# Patient Record
Sex: Male | Born: 1970 | Race: White | Hispanic: No | Marital: Married | Smoking: Never smoker
Health system: Southern US, Community
[De-identification: ages and names within clinical notes are randomized; demographics above are authoritative.]

## PROBLEM LIST (undated history)

## (undated) DIAGNOSIS — Z125 Encounter for screening for malignant neoplasm of prostate: Secondary | ICD-10-CM

## (undated) DIAGNOSIS — Z13220 Encounter for screening for lipoid disorders: Secondary | ICD-10-CM

## (undated) DIAGNOSIS — R109 Unspecified abdominal pain: Secondary | ICD-10-CM

## (undated) HISTORY — DX: Encounter for screening for lipoid disorders: Z13.220

## (undated) HISTORY — DX: Encounter for screening for malignant neoplasm of prostate: Z12.5

## (undated) HISTORY — DX: Unspecified abdominal pain: R10.9

## (undated) HISTORY — PX: APPENDECTOMY: SHX54

---

## 2004-03-29 ENCOUNTER — Emergency Department: Payer: Self-pay | Admitting: General Practice

## 2011-04-03 ENCOUNTER — Emergency Department: Payer: Self-pay | Admitting: Unknown Physician Specialty

## 2011-09-19 ENCOUNTER — Ambulatory Visit: Payer: Self-pay | Admitting: Internal Medicine

## 2011-10-18 ENCOUNTER — Ambulatory Visit: Payer: Self-pay | Admitting: Internal Medicine

## 2011-11-28 ENCOUNTER — Emergency Department: Payer: Self-pay | Admitting: Emergency Medicine

## 2011-12-31 ENCOUNTER — Encounter: Payer: Self-pay | Admitting: Cardiovascular Disease

## 2012-01-07 ENCOUNTER — Ambulatory Visit (INDEPENDENT_AMBULATORY_CARE_PROVIDER_SITE_OTHER): Payer: BC Managed Care – PPO | Admitting: Cardiovascular Disease

## 2012-01-07 ENCOUNTER — Encounter: Payer: Self-pay | Admitting: Cardiovascular Disease

## 2012-01-07 VITALS — BP 128/84 | HR 67 | Ht 70.0 in | Wt 225.5 lb

## 2012-01-07 DIAGNOSIS — R0789 Other chest pain: Secondary | ICD-10-CM

## 2012-01-07 DIAGNOSIS — R002 Palpitations: Secondary | ICD-10-CM

## 2012-01-07 NOTE — Patient Instructions (Addendum)
Your physician has recommended that you wear a holter monitor. Holter monitors are medical devices that record the heart's electrical activity. Doctors most often use these monitors to diagnose arrhythmias. Arrhythmias are problems with the speed or rhythm of the heartbeat. The monitor is a small, portable device. You can wear one while you do your normal daily activities. This is usually used to diagnose what is causing palpitations/syncope (passing out).  Follow up as needed.  

## 2012-01-09 ENCOUNTER — Encounter: Payer: Self-pay | Admitting: Cardiovascular Disease

## 2012-01-09 DIAGNOSIS — R002 Palpitations: Secondary | ICD-10-CM | POA: Insufficient documentation

## 2012-01-09 NOTE — Assessment & Plan Note (Signed)
I do think his bradycardia is concerning. He reports occasional heart rates to the 40s that he is usually asymptomatic with that. The only concerning issue is his complaints of palpitations when his heart rate is low. Thus, I think it's important to rule out other associated arrhythmia. I will obtain a 48-hour Holter monitor. The patient has no symptoms suggestive of heart failure or coronary artery disease. His cardiac exam is normal with no associated murmurs. Thus, no need for an echocardiogram unless  Holter monitor shows significant abnormalities.

## 2012-01-09 NOTE — Progress Notes (Signed)
HPI  This is a 41 year old male who was referred by Dr. Marvis Moeller for evaluation of palpitations and bradycardia. The patient reports no previous cardiac history. He has no chronic medical conditions and does not take any medications. He is not a smoker. He has noticed throughout his life that his heart rate is slow. When his heart rate is slow, he complains of palpitations and skipping in his heart. He denies any dizziness, syncope or presyncope. He reports no chest pain, dyspnea or orthopnea. He is able to do all his physical activities without limitations.  No Known Allergies   Current Outpatient Prescriptions on File Prior to Visit  Medication Sig Dispense Refill  . hydrochlorothiazide (HYDRODIURIL) 25 MG tablet Take 25 mg by mouth daily.         Past Medical History  Diagnosis Date  . Abdominal pain, unspecified site   . Special screening for malignant neoplasm of prostate   . Screening for lipoid disorders      Past Surgical History  Procedure Date  . Appendectomy      History reviewed. No pertinent family history.   History   Social History  . Marital Status: Married    Spouse Name: N/A    Number of Children: N/A  . Years of Education: N/A   Occupational History  . Not on file.   Social History Main Topics  . Smoking status: Never Smoker   . Smokeless tobacco: Not on file  . Alcohol Use: No     quit drinking in 2002  . Drug Use: No  . Sexually Active:    Other Topics Concern  . Not on file   Social History Narrative  . No narrative on file     ROS Constitutional: Negative for fever, chills, diaphoresis, activity change, appetite change and fatigue.  HENT: Negative for hearing loss, nosebleeds, congestion, sore throat, facial swelling, drooling, trouble swallowing, neck pain, voice change, sinus pressure and tinnitus.  Eyes: Negative for photophobia, pain, discharge and visual disturbance.  Respiratory: Negative for apnea, cough, chest tightness,  shortness of breath and wheezing.  Cardiovascular: Negative for chest pain and leg swelling.  Gastrointestinal: Negative for nausea, vomiting, abdominal pain, diarrhea, constipation, blood in stool and abdominal distention.  Genitourinary: Negative for dysuria, urgency, frequency, hematuria and decreased urine volume.  Musculoskeletal: Negative for myalgias, back pain, joint swelling, arthralgias and gait problem.  Skin: Negative for color change, pallor, rash and wound.  Neurological: Negative for dizziness, tremors, seizures, syncope, speech difficulty, weakness, light-headedness, numbness and headaches.  Psychiatric/Behavioral: Negative for suicidal ideas, hallucinations, behavioral problems and agitation. The patient is not nervous/anxious.     PHYSICAL EXAM   BP 128/84  Pulse 67  Ht 5\' 10"  (1.778 m)  Wt 225 lb 8 oz (102.286 kg)  BMI 32.36 kg/m2 Constitutional: He is oriented to person, place, and time. He appears well-developed and well-nourished. No distress.  HENT: No nasal discharge.  Head: Normocephalic and atraumatic.  Eyes: Pupils are equal and round. Right eye exhibits no discharge. Left eye exhibits no discharge.  Neck: Normal range of motion. Neck supple. No JVD present. No thyromegaly present.  Cardiovascular: Normal rate, regular rhythm, normal heart sounds and. Exam reveals no gallop and no friction rub. No murmur heard.  Pulmonary/Chest: Effort normal and breath sounds normal. No stridor. No respiratory distress. He has no wheezes. He has no rales. He exhibits no tenderness.  Abdominal: Soft. Bowel sounds are normal. He exhibits no distension. There is no tenderness.  There is no rebound and no guarding.  Musculoskeletal: Normal range of motion. He exhibits no edema and no tenderness.  Neurological: He is alert and oriented to person, place, and time. Coordination normal.  Skin: Skin is warm and dry. No rash noted. He is not diaphoretic. No erythema. No pallor.    Psychiatric: He has a normal mood and affect. His behavior is normal. Judgment and thought content normal.       EKG: Sinus  Rhythm  WITHIN NORMAL LIMITS   ASSESSMENT AND PLAN

## 2013-08-08 ENCOUNTER — Inpatient Hospital Stay: Payer: Self-pay | Admitting: Internal Medicine

## 2013-08-08 LAB — DRUG SCREEN, URINE
Amphetamines, Ur Screen: NEGATIVE (ref ?–1000)
BARBITURATES, UR SCREEN: NEGATIVE (ref ?–200)
Benzodiazepine, Ur Scrn: NEGATIVE (ref ?–200)
CANNABINOID 50 NG, UR ~~LOC~~: NEGATIVE (ref ?–50)
COCAINE METABOLITE, UR ~~LOC~~: NEGATIVE (ref ?–300)
MDMA (Ecstasy)Ur Screen: NEGATIVE (ref ?–500)
METHADONE, UR SCREEN: NEGATIVE (ref ?–300)
OPIATE, UR SCREEN: NEGATIVE (ref ?–300)
PHENCYCLIDINE (PCP) UR S: NEGATIVE (ref ?–25)
TRICYCLIC, UR SCREEN: NEGATIVE (ref ?–1000)

## 2013-08-08 LAB — URINALYSIS, COMPLETE
BACTERIA: NONE SEEN
BILIRUBIN, UR: NEGATIVE
Blood: NEGATIVE
Glucose,UR: NEGATIVE mg/dL (ref 0–75)
Hyaline Cast: 2
KETONE: NEGATIVE
LEUKOCYTE ESTERASE: NEGATIVE
Nitrite: NEGATIVE
PH: 6 (ref 4.5–8.0)
Protein: NEGATIVE
RBC,UR: <1 /HPF (ref 0–5)
SPECIFIC GRAVITY: 1.012 (ref 1.003–1.030)
Squamous Epithelial: NONE SEEN
WBC UR: <1 /HPF (ref 0–5)

## 2013-08-08 LAB — COMPREHENSIVE METABOLIC PANEL
ALK PHOS: 95 U/L
ALT: 41 U/L (ref 12–78)
ANION GAP: 6 — AB (ref 7–16)
Albumin: 3.8 g/dL (ref 3.4–5.0)
BILIRUBIN TOTAL: 0.6 mg/dL (ref 0.2–1.0)
BUN: 13 mg/dL (ref 7–18)
CALCIUM: 8.7 mg/dL (ref 8.5–10.1)
CHLORIDE: 106 mmol/L (ref 98–107)
CO2: 25 mmol/L (ref 21–32)
Creatinine: 1.08 mg/dL (ref 0.60–1.30)
EGFR (Non-African Amer.): >60
GLUCOSE: 103 mg/dL — AB (ref 65–99)
Osmolality: 274 (ref 275–301)
Potassium: 3.9 mmol/L (ref 3.5–5.1)
SGOT(AST): 18 U/L (ref 15–37)
SODIUM: 137 mmol/L (ref 136–145)
TOTAL PROTEIN: 7.1 g/dL (ref 6.4–8.2)

## 2013-08-08 LAB — CK TOTAL AND CKMB (NOT AT ARMC)
CK, Total: 100 U/L
CK-MB: 1 ng/mL (ref 0.5–3.6)

## 2013-08-08 LAB — TROPONIN I
Troponin-I: <0.02 ng/mL
Troponin-I: <0.02 ng/mL

## 2013-08-08 LAB — CBC WITH DIFFERENTIAL/PLATELET
Basophil #: 0.1 10*3/uL (ref 0.0–0.1)
Basophil %: 0.9 %
EOS ABS: 0.1 10*3/uL (ref 0.0–0.7)
EOS PCT: 1 %
HCT: 43.8 % (ref 40.0–52.0)
HGB: 15.1 g/dL (ref 13.0–18.0)
Lymphocyte #: 3.2 10*3/uL (ref 1.0–3.6)
Lymphocyte %: 41.8 %
MCH: 29.7 pg (ref 26.0–34.0)
MCHC: 34.5 g/dL (ref 32.0–36.0)
MCV: 86 fL (ref 80–100)
MONO ABS: 0.5 x10 3/mm (ref 0.2–1.0)
MONOS PCT: 6.8 %
NEUTROS ABS: 3.8 10*3/uL (ref 1.4–6.5)
NEUTROS PCT: 49.5 %
Platelet: 248 10*3/uL (ref 150–440)
RBC: 5.08 10*6/uL (ref 4.40–5.90)
RDW: 12.9 % (ref 11.5–14.5)
WBC: 7.7 10*3/uL (ref 3.8–10.6)

## 2013-08-08 LAB — PROTIME-INR
INR: 1
Prothrombin Time: 12.6 secs (ref 11.5–14.7)

## 2013-08-08 LAB — HEMOGLOBIN A1C: Hemoglobin A1C: 5.7 % (ref 4.2–6.3)

## 2014-06-07 ENCOUNTER — Emergency Department: Payer: Self-pay | Admitting: Emergency Medicine

## 2014-09-04 NOTE — Discharge Summary (Signed)
PATIENT NAMTroy [name]:  [name], [name] MR#:  [id number] DATE OF BIRTH:  02/26/1971  DATE OF ADMISSION:  08/08/2013 DATE OF DISCHARGE:  08/09/2013  PRIMARY CARE PHYSICIAN: Christian CroonMeindert Niemeyer, MD  PRIMARY CARE PHYSICIAN: Christian BlackwaterShaukat Khan, MD, cardiologist.   REASON FOR ADMISSION: Palpitations.   HOSPITAL COURSE: The patient is a 44 year old Caucasian male with a history of palpitation, presented to the ED with palpitations and a slow heart rate at about 20 to 30. The patient had chest tightness as well. He has a slow heart rate. In addition, the patient gained 40 pounds for the past 3 to 4 months with leg edema. For detailed history and physical examination, please refer to the admission note dictated by me. On admission date, the patient's glucose 103, BUN 13, creatinine 1.08. Electrolytes were normal. Troponin less than 0.02. CBC within normal range. Chest x-ray did not show any cardiopulmonary disease. EKG showed normal sinus rhythm with a 68 BPM. Urinalysis is negative. Hemoglobin A1c 5.7. Drug screen was negative, severe bradycardia. After admission, the patient has been on telemonitor. The patient's heart rate has been from 50s to 70s and the patient got an echocardiograph which was normal with an ejection fraction 50% to 55% and Dr. Welton [name] evaluated the patient and suggested the patient has no severe bradycardia during the hospitalization. He may be discharged and follow up with him as outpatient. The patient needs a Holter monitor as an outpatient.  FINAL DIAGNOSES: Severe bradycardia, tachybrady syndrome, obesity.   CONDITION: Stable.   CODE STATUS: Full code.   HOME MEDICATIONS: Please refer to medication reconciliation list. Continue the patient's home medication.   DIET: Low-fat, low-cholesterol diet.   ACTIVITY: As tolerated.   FOLLOWUP CARE: Follow with his PCP in 2-4 weeks. Follow up with Dr. Welton [name] within 1-2 weeks.   I discussed the patient's discharge plan with the patient, nurse, Dr. Welton [name].    TIME SPENT: About 36 minutes.    ____________________________ Christian PollackQing Kharter Brew, MD qc:lt D: 08/09/2013 15:31:33 ET T: 08/10/2013 03:50:04 ET JOB#: [id number]  cc: Christian PollackQing Jerold Yoss, MD, <Dictator> Christian PollackQING Bayan Hedstrom MD ELECTRONICALLY SIGNED 08/10/2013 13:54

## 2014-09-04 NOTE — H&P (Signed)
PATIENT NAMTroy [name]:  [name], [name] MR#:  [id number] DATE OF BIRTH:  1970/12/09  DATE OF ADMISSION:  08/08/2013  PRIMARY CARE PHYSICIAN:  Dr. Lacie ScottsNiemeyer.  CHIEF COMPLAINT:  Palpitation since last night.   HISTORY OF PRESENT ILLNESS:  A 44 year old Caucasian male with a past history of palpitations who presented in the ED with palpitations since last night.  The patient is alert, awake, oriented, in no acute distress.  According to the patient, the patient felt palpitation since last night until early this morning.  The patient monitored his heart rate with some monitor.  He found his heart rate was slow with the lowest heart rate about 20 to 30, so patient came to the ED for further evaluation.  The patient also feels chest tightness, revealed the patient's heart rate is slow.  Actually, the patient has had a cough with sputum on and off for the past one year.  He also complains of leg edema and gained weight about 40 pounds for the past 3 to 4 months.  In addition, the patient complains of polyuria, but he denies any orthopnea, nocturnal dyspnea.   PAST MEDICAL HISTORY:  Palpitations.  PAST SURGICAL HISTORY:  Appendectomy.   FAMILY HISTORY:  Father has CHF.  Sister has CHF.  No heart attack or stroke in his family.   HOME MEDICATIONS:  1.  Nexium 20 mg by mouth two cap once a day.  2.  EpiPen two pack 0.3 mg injectable once.  3.  Cetirizine 10 mg by mouth once a day.   REVIEW OF SYSTEMS:  CONSTITUTIONAL:  The patient denies any fever or chills.  No headache or dizziness.  No weakness.  EYES:  No double vision or blurred vision.  EARS, NOSE, THROAT:  No postnasal drip, slurred speech or dysphagia.  CARDIOVASCULAR:  No chest pain, but has chest tightness, palpitations.  No orthopnea, nocturnal dyspnea, but has leg edema.  PULMONARY:  Has a cough with sputum.  Has mild shortness of breath, but no hemoptysis.  GASTROINTESTINAL:  No abdominal pain, nausea, vomiting, diarrhea.  No melena or bloody stool.   GENITOURINARY:  No dysuria, hematuria, or incontinence.  SKIN:  No rash or jaundice.  NEUROLOGY:  No syncope, loss of consciousness or seizure.  HEMATOLOGIC:  No easy bleeding or bleeding.  ENDOCRINE:  Positive for polyuria, but  no polydipsia, heat or cold intolerance.  SKIN:  No rash or jaundice.   PHYSICAL EXAMINATION: VITAL SIGNS:  Temperature 98.2, blood pressure 132/83, pulse 52, O2 saturation 97% on room air.  GENERAL:  The patient is alert, awake, oriented, in no acute distress.  HEENT:  Pupils round, equal, reactive to light and accommodation.  Moist oral mucosa.  Clear oropharynx. NECK:  Supple.  No JVD or carotid bruit.  No lymphadenopathy.  No thyromegaly.  CARDIOVASCULAR:  S1, S2, regular rate, rhythm.  No murmur, no gallop.  PULMONARY:  Bilateral air entry.  No wheezing or rales.  No use of accessory muscle to breathe.  ABDOMEN:  Soft and obese.  Bowel sounds present.  No distention or tenderness.  No organomegaly.  EXTREMITIES:  Trace edema on bilateral lower extremities.  No clubbing.  No cyanosis.  No calf tenderness.  Bilateral pedal pulses present.  SKIN:  No rash or jaundice.  NEUROLOGIC:  A and O x 3.  No focal deficit.  Power 5 out of 5.  Sensation intact.   LABORATORY DATA:  Glucose 103, BUN 13, creatinine 1.08.  Electrolytes normal.  Troponin less than  0.02 three times.  CBC in normal range.  Chest x-ray, no active cardiopulmonary disease.  EKG showed normal sinus rhythm at 68 BPM.  Urinalysis is negative.  Hemoglobin A1c 5.7.  Drug screen negative.   IMPRESSIONS: 1.  Severe bradycardia.  2.  Questionable congestive heart failure.  3.  Obesity.  4.  Metabolic syndrome.   PLAN OF TREATMENT:   1.  The patient will be admitted to telemetry floor.  We will get an echocardiograph, cardiology consult and we will start Lasix 20 mg by mouth daily.   2.  Gastrointestinal and deep vein thrombosis prophylaxis.    I discussed the patient's condition and plan of treatment  with the patient and the patient's wife.   TIME SPENT:  About 58 minutes.    ____________________________ Shaune Pollack, MD qc:ea D: 08/08/2013 14:00:47 ET T: 08/08/2013 16:08:32 ET JOB#: [id number]  cc: Shaune Pollack, MD, <Dictator> Shaune Pollack MD ELECTRONICALLY SIGNED 08/09/2013 15:46

## 2014-09-04 NOTE — Consult Note (Signed)
PATIENT NAME:  [name], [place] MR#:  [id number] DATE OF BIRTH:  04/26/1971  CARDIOLOGY CONSULTATION   DATE OF CONSULTATION:  08/08/2013  CONSULTING PHYSICIAN:  Laurier NancyShaukat A. Khan, MD  INDICATION FOR CONSULTATION: Bradycardia and palpitations.   HISTORY OF PRESENT ILLNESS: This is a 44 year old white male with a past medical history of no significant medical problems, who presented to the Emergency Room with palpitations and chest tightness. Apparently, he states that he has bradycardia at home. He monitors his pulse by pulse oximetry, and he says his bradycardia was as low as 13 one time, but he does not have a video of that. He has a video of heart rate going as low as 36 on pulse oximetry made by his iPhone. He says that he had some chest pain associated with shortness of breath when his heart rate went down as low as 20 to 30, and that is why he came into the hospital. He has history of having bradycardia in the past also. He usually has these bouts every year.   PAST MEDICAL HISTORY: He has no history of hypertension, diabetes, hyperlipidemia.   SOCIAL HISTORY: Denies EtOH abuse or smoking.   FAMILY HISTORY: Positive for coronary artery disease.   ALLERGIES: None.   PHYSICAL EXAMINATION:  GENERAL: Reveals his blood pressure is 132/83, pulse is 52, respirations 16, temperature is 98.2, saturation is 97.  NECK: Revealed no JVD.  LUNGS: Clear.  HEART: Regular rate and rhythm. Normal S1, S2. No audible murmur.  ABDOMEN: Soft, nontender. Positive bowel sounds.  EXTREMITIES: No pedal edema.  NEUROLOGICAL: Appears to be intact.   DIAGNOSTIC STUDIES: EKG shows sinus rhythm, 67 beats per minute. No acute changes. There are some rhythm strips that show a heart rate about 45 to 50, and on the monitor, it also shows as low as 45, but no pauses and no evidence of severe bradycardia at this time, and hemodynamically, appears to be stable.   ASSESSMENT AND PLAN: The patient has bradycardia, supposedly  in the 20s to 30s, need documentation. Pulse oximetry is not very accurate in documenting bradycardia. Need documentation either on monitor or Holter monitor. Need at least 3-second pauses to qualify for pacemaker. The patient appears to be quite asymptomatic at this time. Advise observation on telemetry for another day or two and may consider after that. If there is no severe bradycardia, he can be discharged, with followup in the office for event monitor.  Thank you very much for referral.   ____________________________ Laurier NancyShaukat A. Khan, MD sak:lb D: 08/08/2013 11:23:17 ET T: 08/08/2013 11:39:37 ET JOB#: [id number]  cc: Laurier NancyShaukat A. Khan, MD, <Dictator> Laurier NancySHAUKAT A KHAN MD ELECTRONICALLY SIGNED 08/26/2013 13:58

## 2015-04-15 IMAGING — CR DG CHEST 2V
1 series · 2 of 2 positions shown · non-contrast
Comparison: Prior CT from 10/18/2011

CLINICAL DATA: Chest pain and palpitations

EXAM:
CHEST  2 VIEW

[Series 2: w chest lat · 0.14mm/px · 2 of 2 slices shown]
[im 1/2]
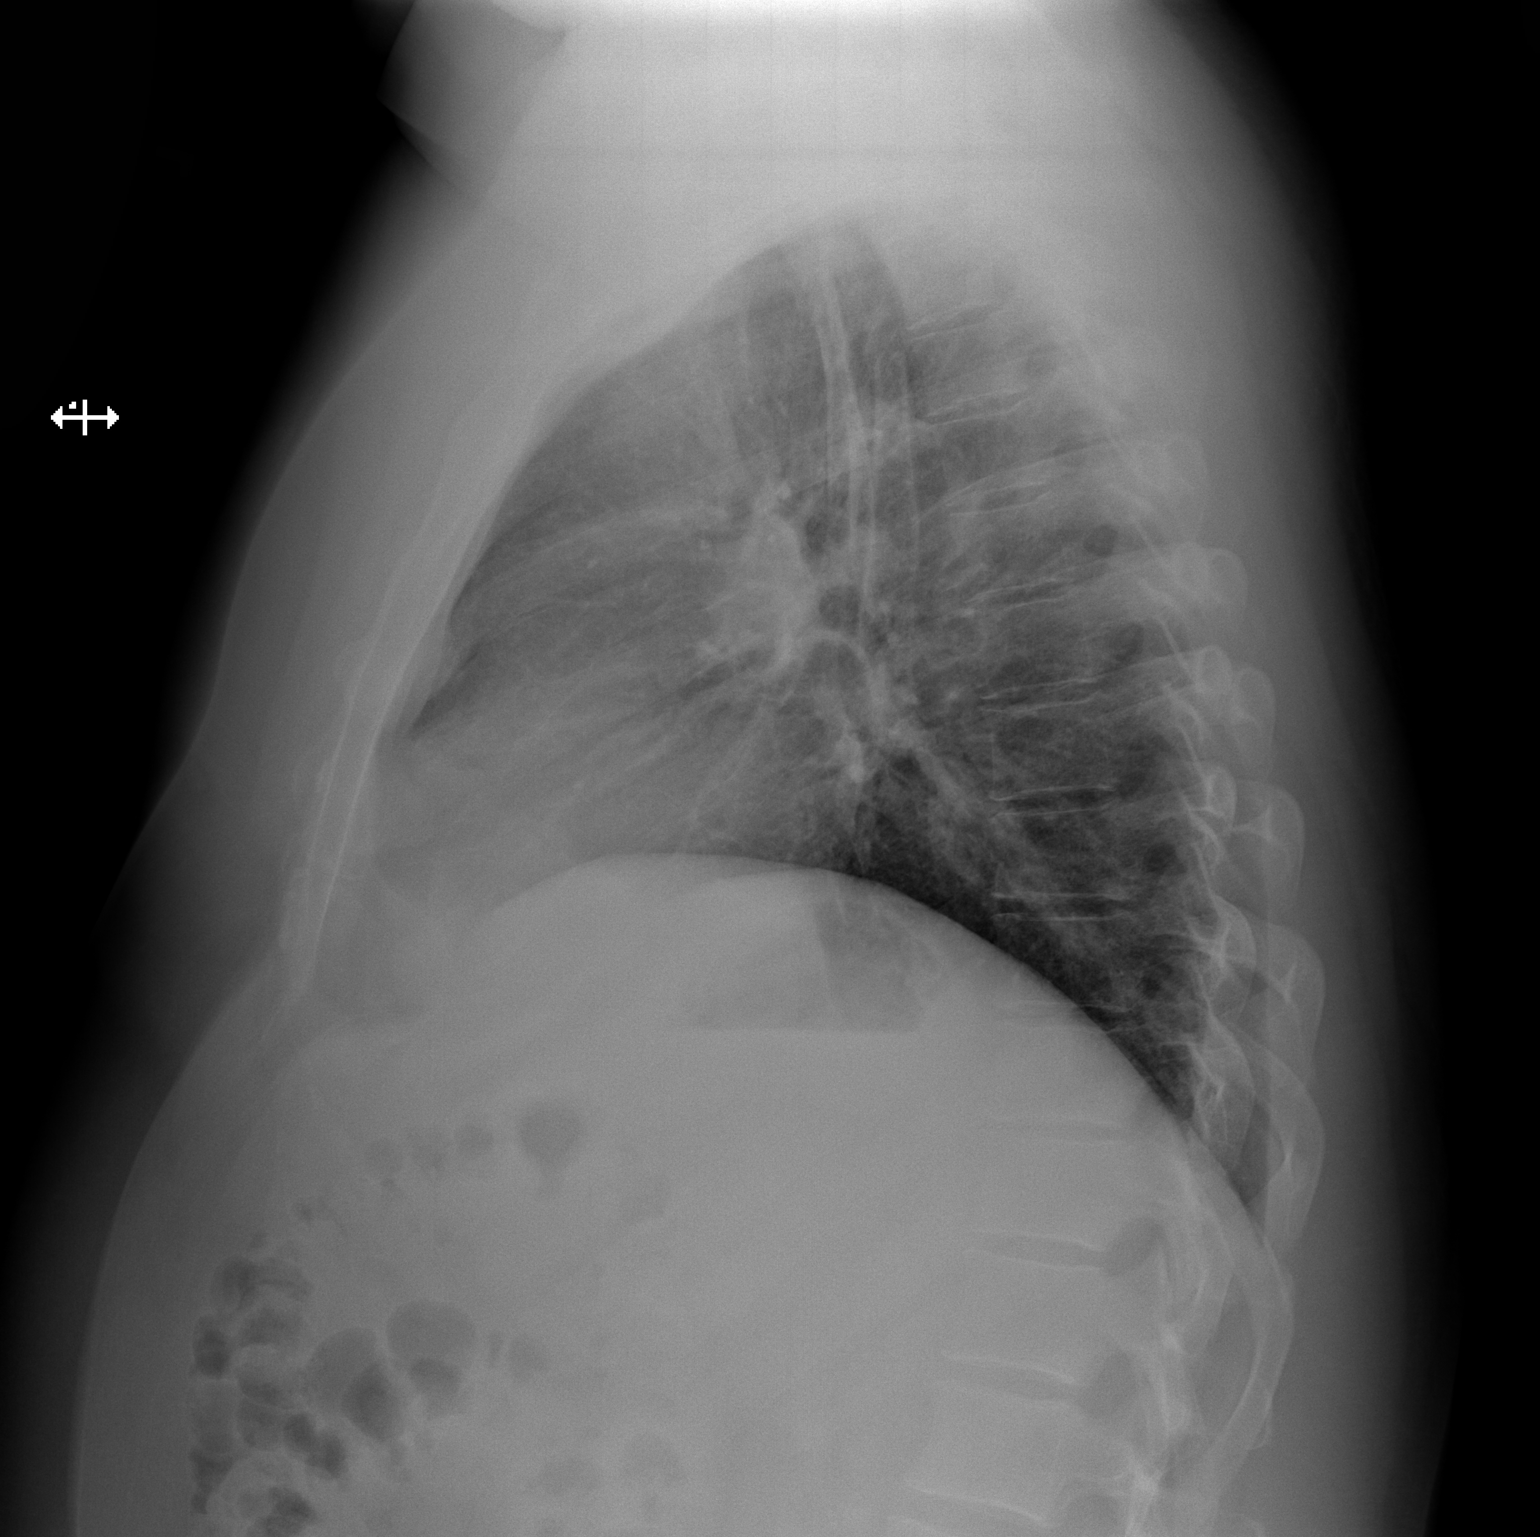
[im 2/2]
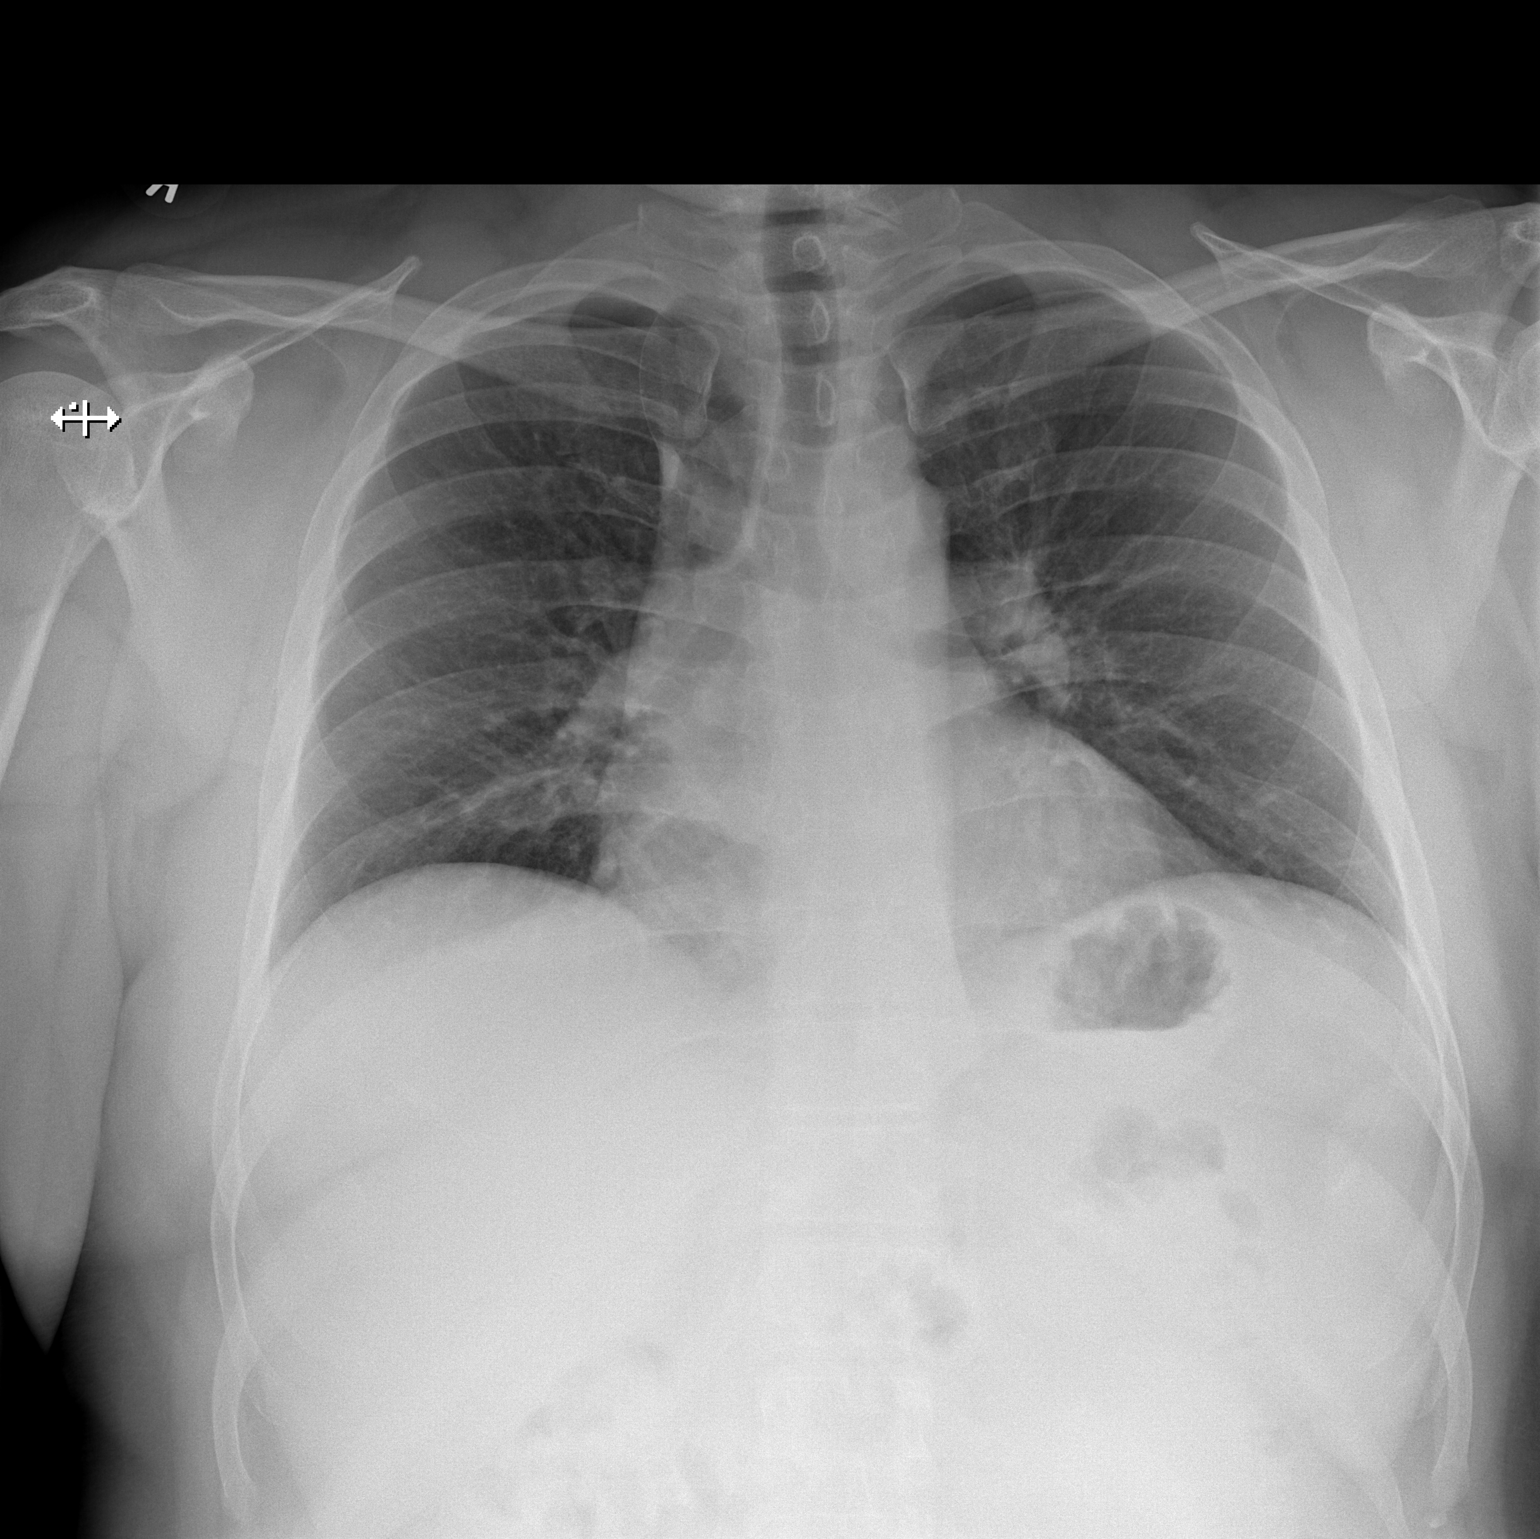

[2 of 2 positions shown; findings below may reference images not displayed]

FINDINGS: The cardiac and mediastinal silhouettes are stable in size and
contour, and remain within normal limits.

The lungs are mildly hypoinflated. No airspace consolidation,
pleural effusion, or pulmonary edema is identified. There is no
pneumothorax.

No acute osseous abnormality identified.
IMPRESSION: No active cardiopulmonary disease.

## 2016-02-12 IMAGING — CR SACRUM AND COCCYX - 2+ VIEW
1 series · 3 of 3 positions shown · non-contrast
Comparison: None.

CLINICAL DATA: Fall.  Sacral pain.

EXAM:
SACRUM AND COCCYX - 2+ VIEW

[Series 1: dxr sacrum and coccyx · 0.14mm/px · 3 of 3 slices shown]
[im 1/3]
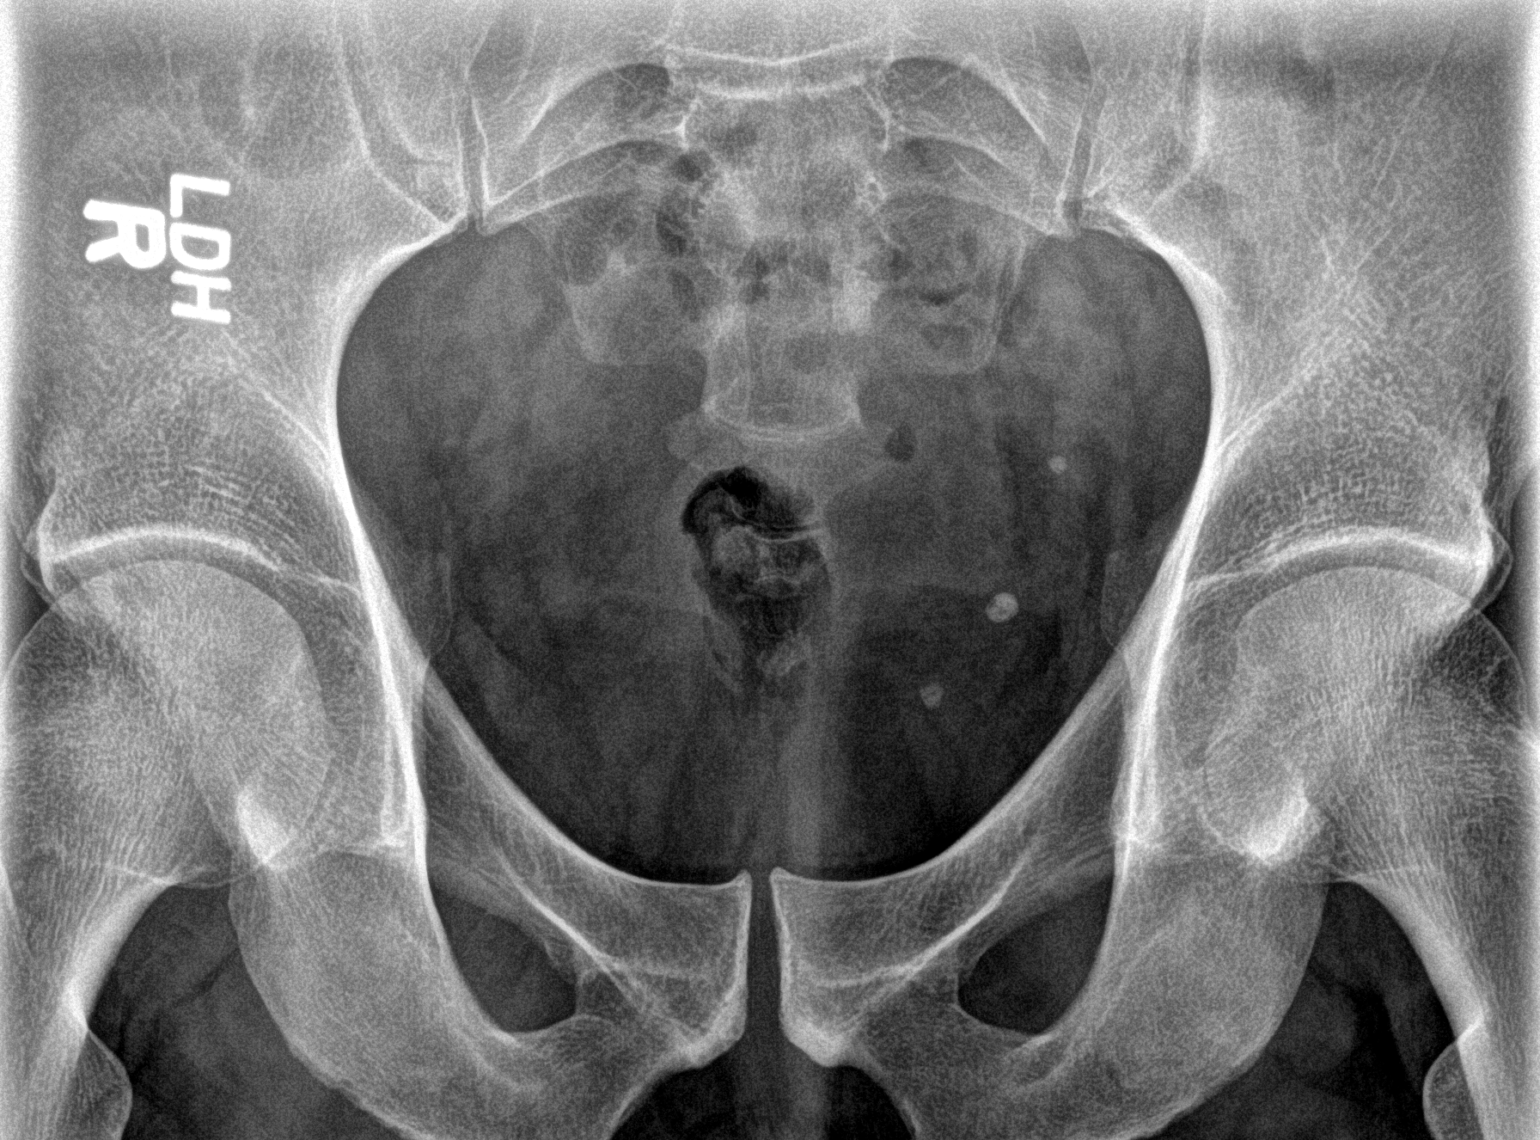
[im 2/3]
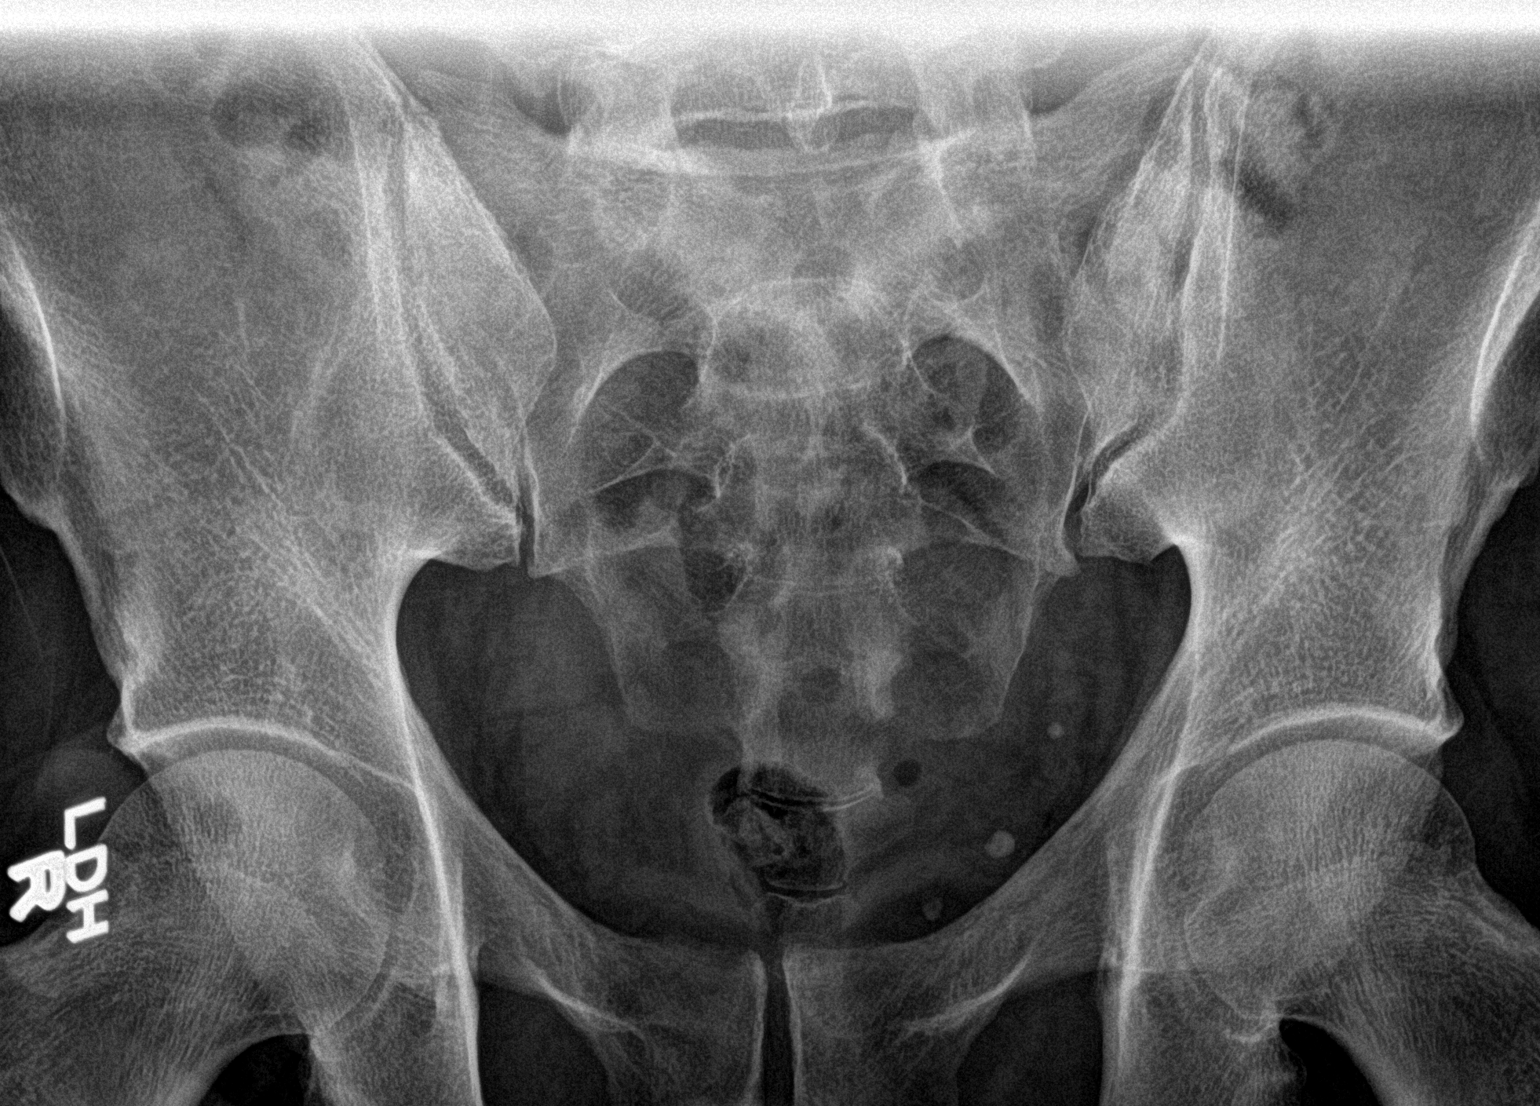
[im 3/3]
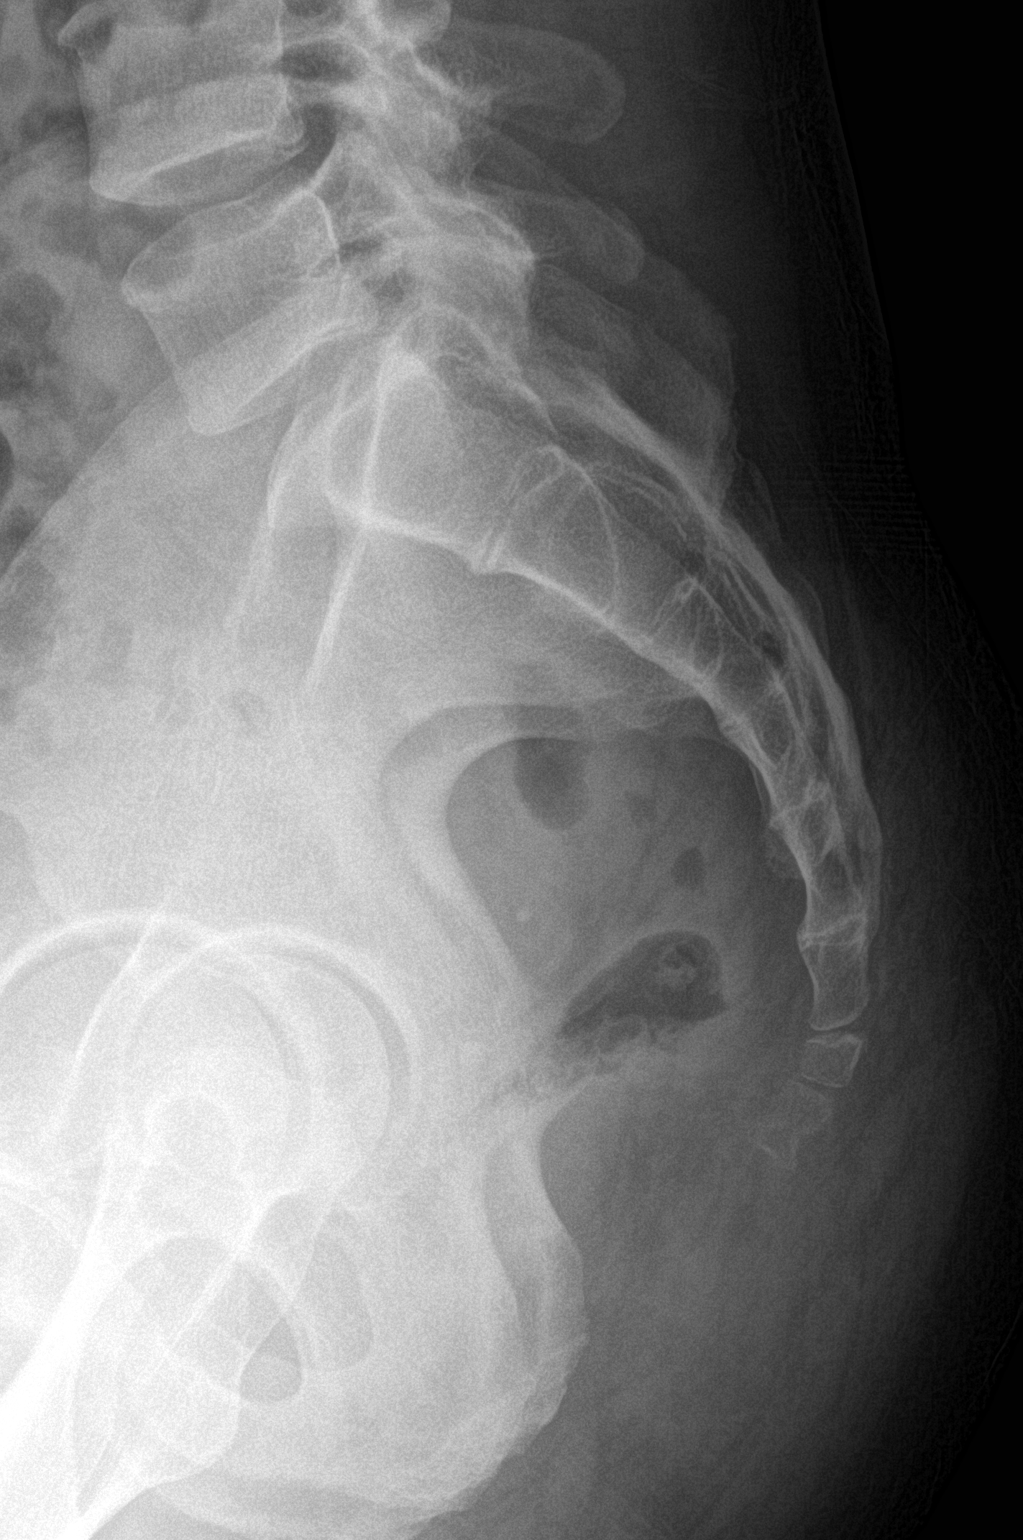

[3 of 3 positions shown; findings below may reference images not displayed]

FINDINGS: There is no evidence of fracture or other focal bone lesions.
IMPRESSION: Negative.

## 2016-02-12 IMAGING — CR DG SHOULDER 3+V*R*
1 series · 3 of 3 positions shown · non-contrast
Comparison: None.

CLINICAL DATA: Status post slip and fall today. Right shoulder
pain. Initial encounter.

EXAM:
DG SHOULDER 3+ VIEWS RIGHT

[Series 1: dxr shoulder right complete · 0.14mm/px · 3 of 3 slices shown]
[im 1/3]
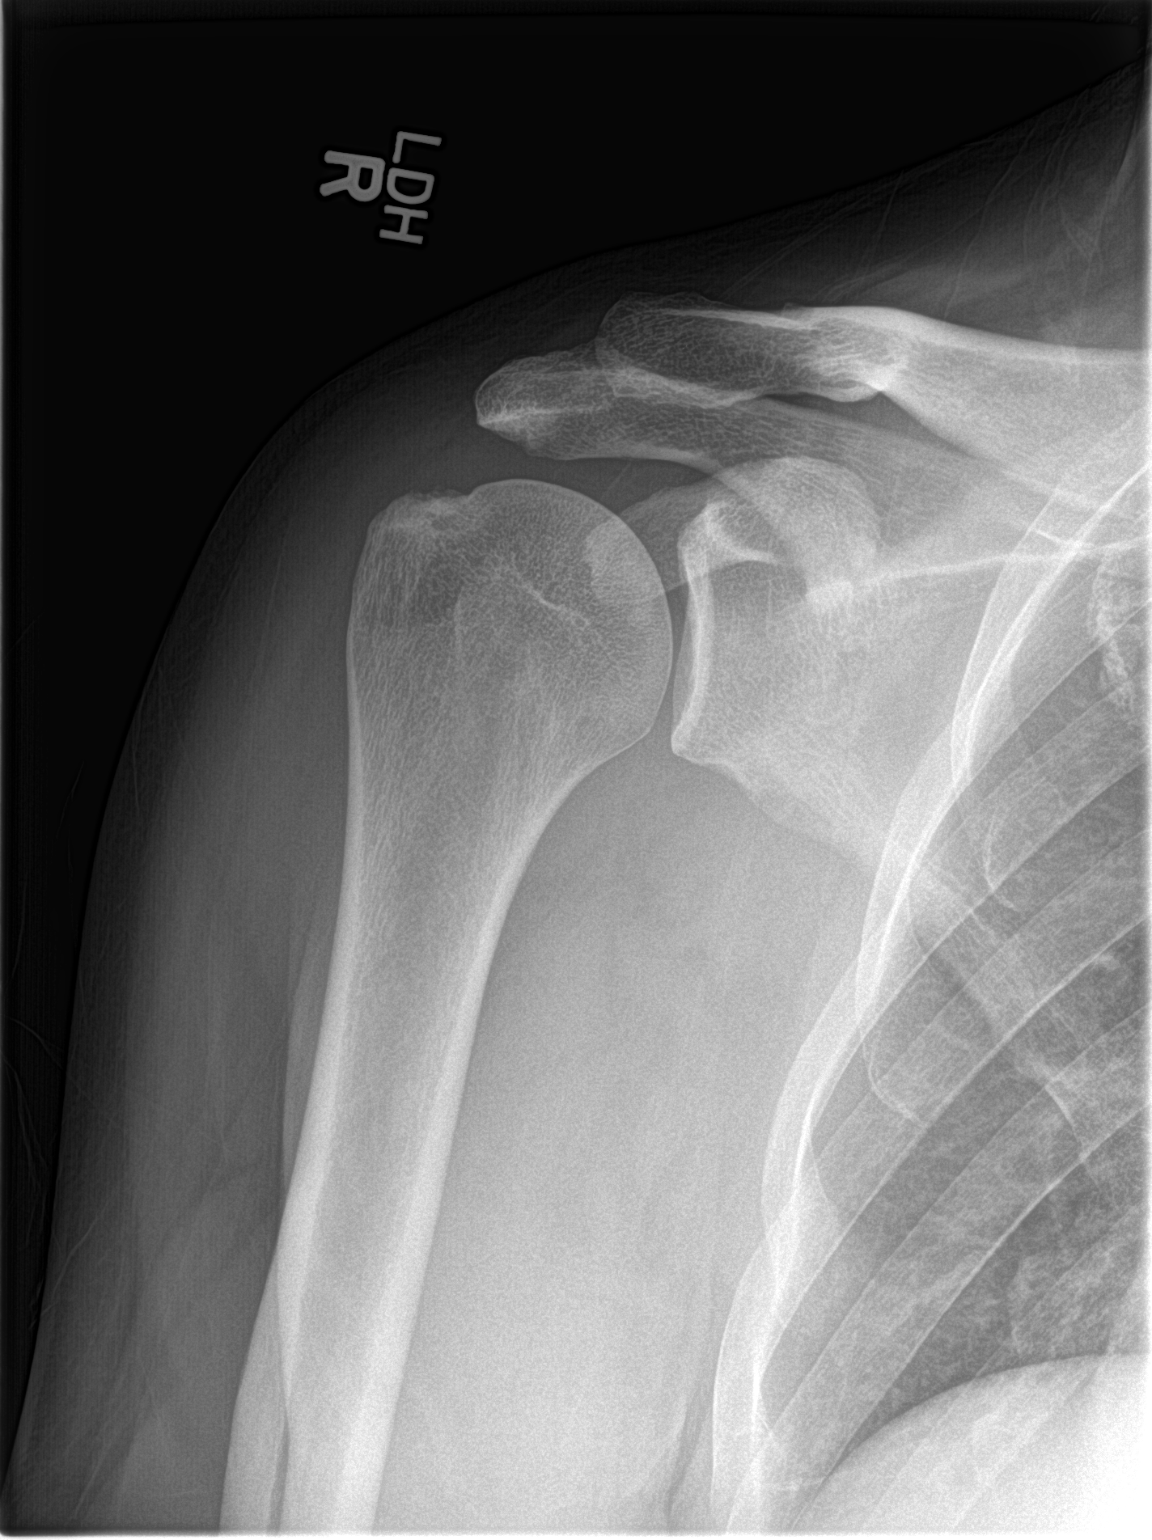
[im 2/3]
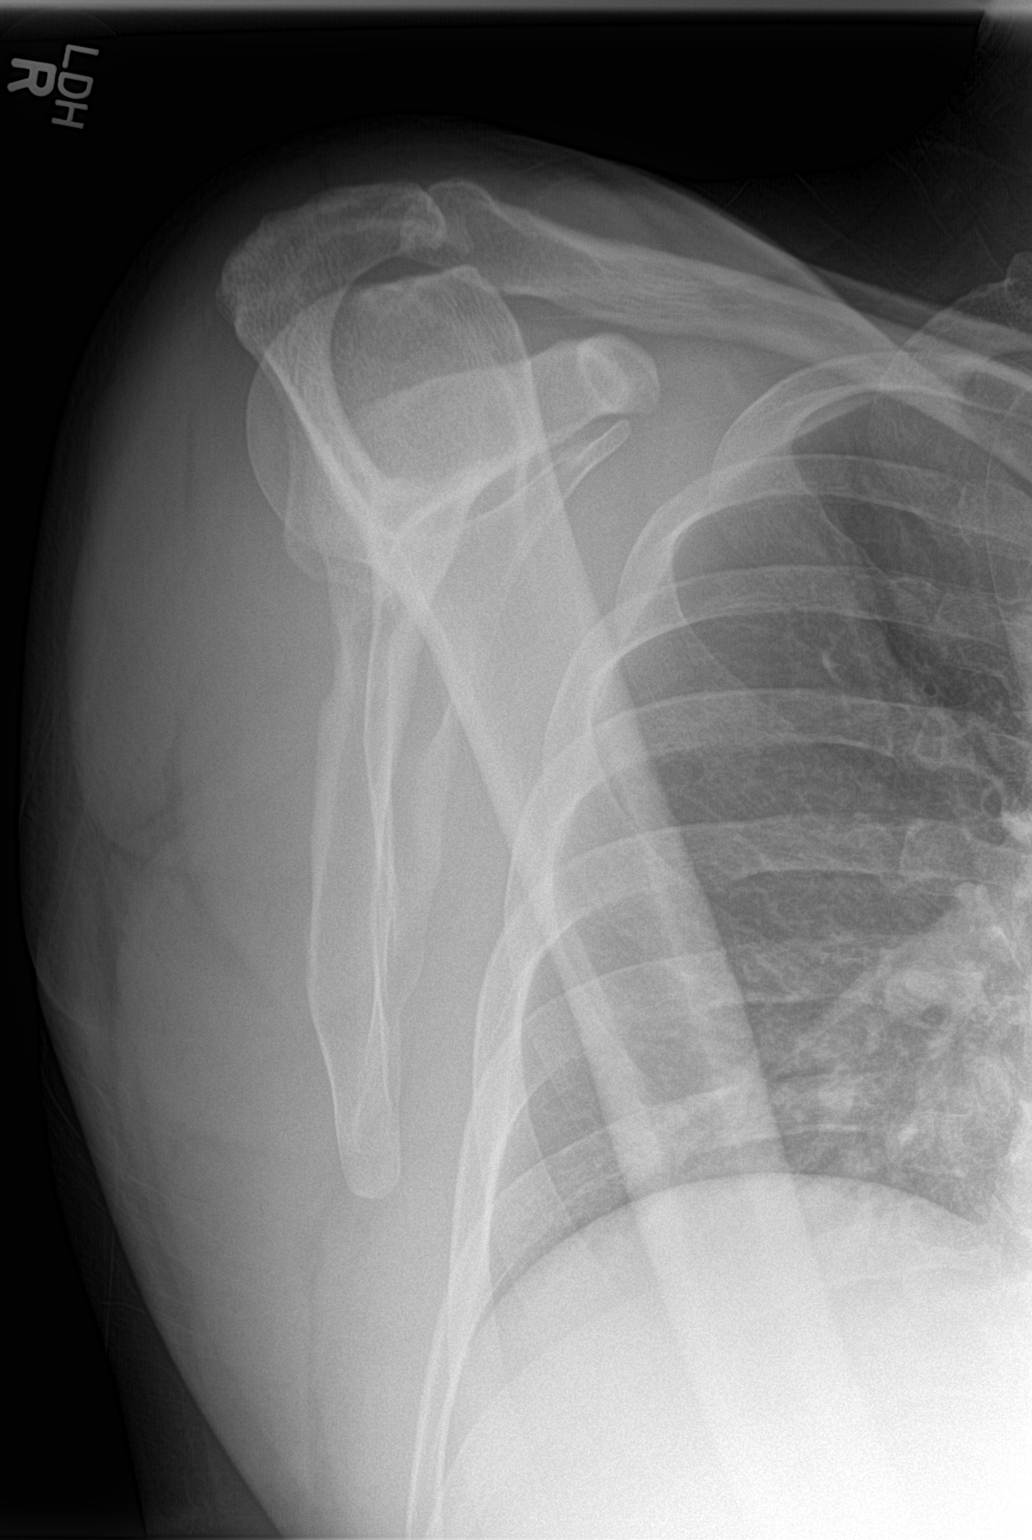
[im 3/3]
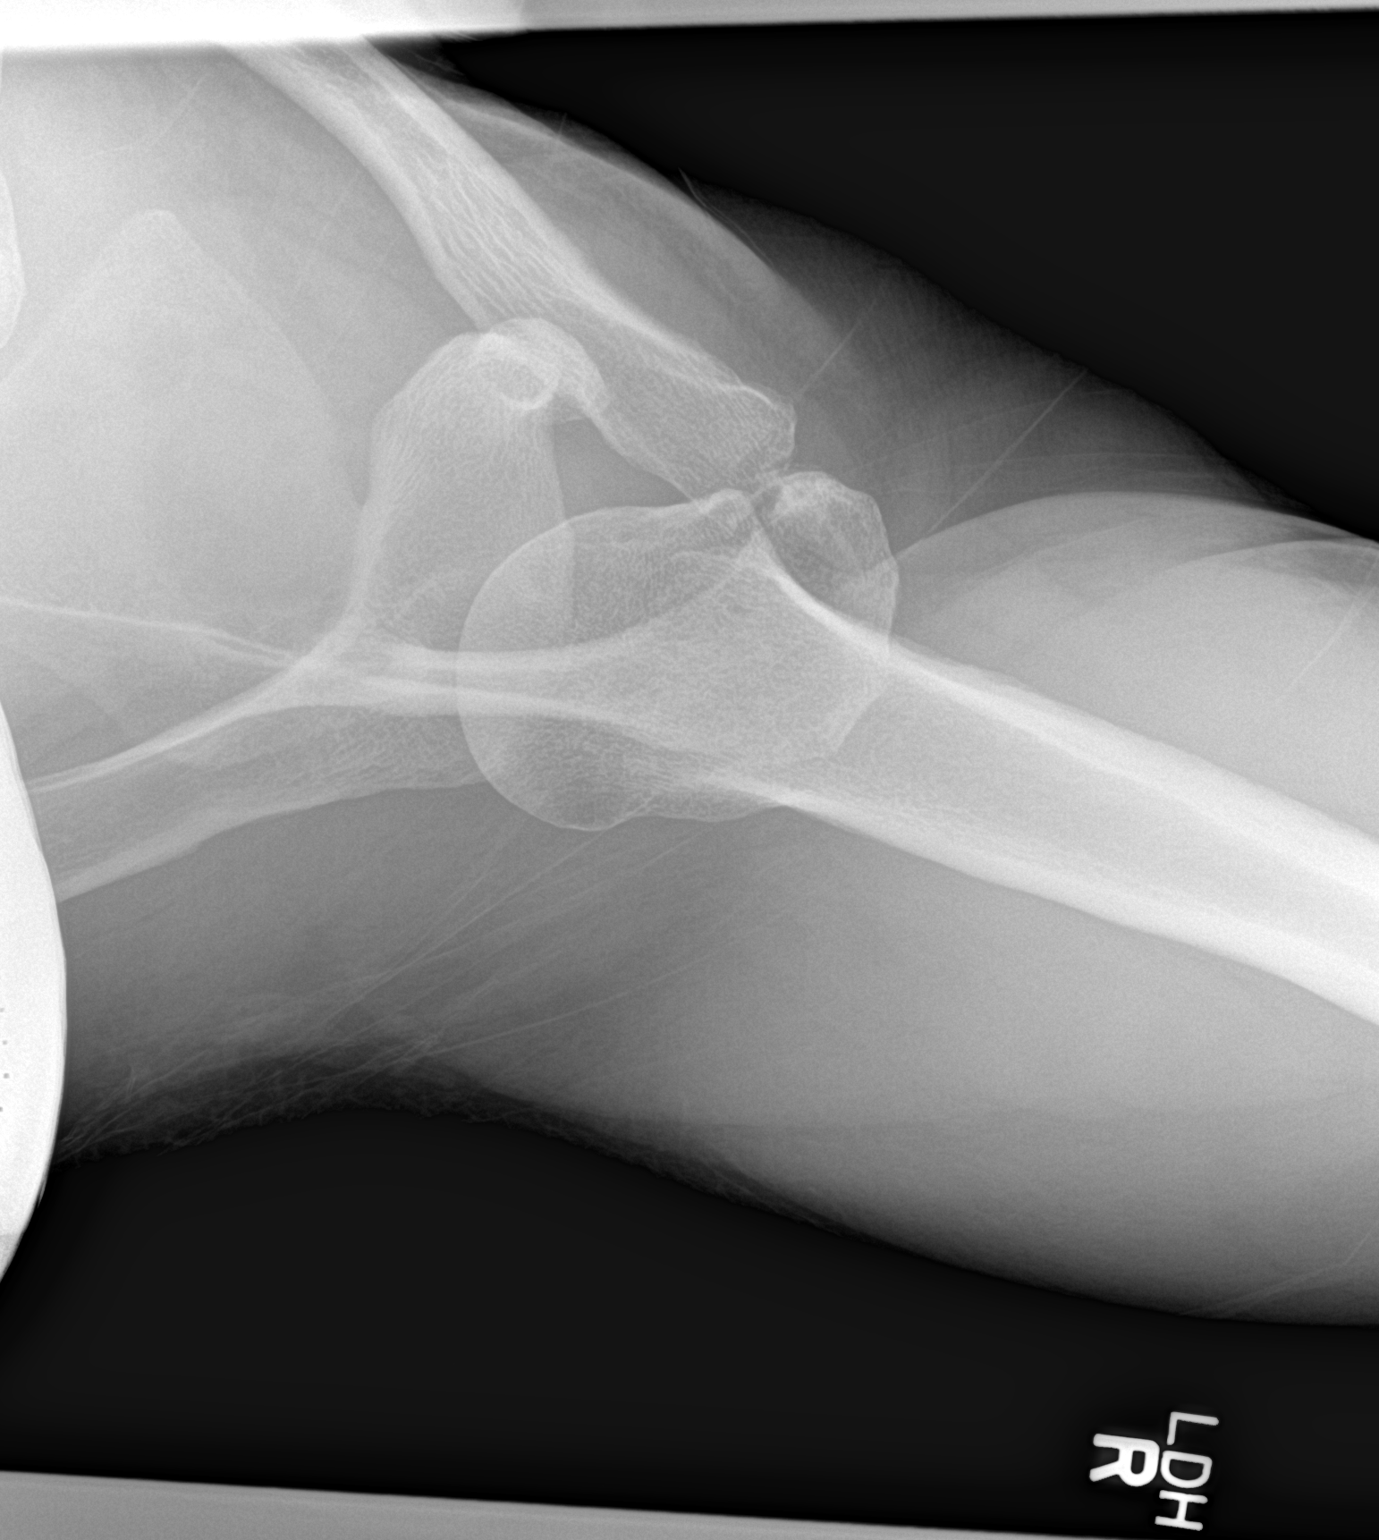

[3 of 3 positions shown; findings below may reference images not displayed]

FINDINGS: There is no evidence of fracture or dislocation. There is no
evidence of arthropathy or other focal bone abnormality. Soft
tissues are unremarkable.
IMPRESSION: Negative exam.

## 2017-04-23 ENCOUNTER — Ambulatory Visit: Payer: Self-pay | Admitting: Podiatry

## 2017-06-25 ENCOUNTER — Encounter: Payer: Self-pay | Admitting: Podiatry

## 2017-06-25 ENCOUNTER — Ambulatory Visit (INDEPENDENT_AMBULATORY_CARE_PROVIDER_SITE_OTHER): Payer: Self-pay | Admitting: Podiatry

## 2017-06-25 VITALS — BP 133/89 | HR 80 | Resp 16

## 2017-06-25 DIAGNOSIS — M5416 Radiculopathy, lumbar region: Secondary | ICD-10-CM

## 2017-06-25 NOTE — Progress Notes (Signed)
   Subjective:    Patient ID: [name], male    DOB: [birth year], 47 y.o.   MRN: [id number]  HPI Chief Complaint  Patient presents with  . Foot Orthotics    Requesting orthotics for low back pain      Review of Systems  Musculoskeletal: Positive for arthralgias, back pain, gait problem and myalgias.  Neurological: Positive for weakness.  All other systems reviewed and are negative.      Objective:   Physical Exam        Assessment & Plan:

## 2017-06-26 ENCOUNTER — Ambulatory Visit: Payer: Self-pay | Admitting: Orthotics

## 2017-06-26 DIAGNOSIS — M722 Plantar fascial fibromatosis: Secondary | ICD-10-CM

## 2017-06-26 DIAGNOSIS — M5416 Radiculopathy, lumbar region: Secondary | ICD-10-CM

## 2017-06-26 NOTE — Progress Notes (Signed)
Patient came in to be evaluated and cast for foot orthotics to help with balance and back issues..  Plan on full contact f/o  Self pay 300.

## 2017-06-27 NOTE — Progress Notes (Signed)
   HPI: 47 year old male presenting today as a new patient with a chief complaint of lower back pain that has been ongoing for the past two years. He is currently followed by neurosurgeon, Dr. Lovell SheehanJenkins, for treatment. He is requesting custom molded orthotics. Patient is here for further evaluation and treatment.   Past Medical History:  Diagnosis Date  . Abdominal pain, unspecified site   . Screening for lipoid disorders   . Special screening for malignant neoplasm of prostate      Physical Exam: General: The patient is alert and oriented x3 in no acute distress.  Dermatology: Skin is warm, dry and supple bilateral lower extremities. Negative for open lesions or macerations.  Vascular: Palpable pedal pulses bilaterally. No edema or erythema noted. Capillary refill within normal limits.  Neurological: Epicritic and protective threshold grossly intact bilaterally.   Musculoskeletal Exam: Range of motion within normal limits to all pedal and ankle joints bilateral. Muscle strength 5/5 in all groups bilateral.    Assessment: - Lumbar radiculopathy x 2 years   Plan of Care:  - Patient evaluated.  - Continue management by spine specialist, Dr. Lovell SheehanJenkins.  - Appointment with Raiford Nobleick for custom molded orthotics.  - Return to clinic as needed.    Felecia ShellingBrent M. Tayjon Halladay, DPM [facility]  Dr. Felecia ShellingBrent M. Martisha Toulouse, DPM    [address]                                        [place], [place] [address]                Office [phone]  Fax [phone]

## 2017-07-24 ENCOUNTER — Ambulatory Visit: Payer: Self-pay | Admitting: Orthotics

## 2017-07-24 DIAGNOSIS — M5416 Radiculopathy, lumbar region: Secondary | ICD-10-CM

## 2017-07-24 NOTE — Progress Notes (Addendum)
Patient came in today to pick up custom made foot orthotics.  The goals were accomplished and the patient reported no dissatisfaction with said orthotics.  Patient was advised of breakin period and how to report any issues.  Patient wants second pair 200.o

## 2017-10-11 DIAGNOSIS — M722 Plantar fascial fibromatosis: Secondary | ICD-10-CM

## 2018-05-21 ENCOUNTER — Ambulatory Visit: Payer: Self-pay | Admitting: Orthotics

## 2018-05-21 DIAGNOSIS — M5416 Radiculopathy, lumbar region: Secondary | ICD-10-CM

## 2018-05-21 NOTE — Progress Notes (Signed)
Getting f//o redone as topcover is 3/8 too short,and arch is 1/8" too high

## 2018-06-11 ENCOUNTER — Ambulatory Visit: Payer: Self-pay | Admitting: Orthotics

## 2018-06-11 DIAGNOSIS — M5416 Radiculopathy, lumbar region: Secondary | ICD-10-CM

## 2018-06-11 NOTE — Progress Notes (Signed)
Patient came in today to pick up custom made foot orthotics.  The goals were accomplished and the patient reported no dissatisfaction with said orthotics.  Patient was advised of breakin period and how to report any issues. 

## 2019-07-27 ENCOUNTER — Other Ambulatory Visit: Payer: Self-pay | Admitting: Dermatology

## 2020-02-08 DIAGNOSIS — M722 Plantar fascial fibromatosis: Secondary | ICD-10-CM

## 2021-01-04 ENCOUNTER — Other Ambulatory Visit: Payer: Self-pay

## 2021-01-04 ENCOUNTER — Ambulatory Visit (INDEPENDENT_AMBULATORY_CARE_PROVIDER_SITE_OTHER): Payer: Self-pay | Admitting: Dermatology

## 2021-01-04 ENCOUNTER — Encounter: Payer: Self-pay | Admitting: Dermatology

## 2021-01-04 DIAGNOSIS — L439 Lichen planus, unspecified: Secondary | ICD-10-CM

## 2021-01-04 DIAGNOSIS — L821 Other seborrheic keratosis: Secondary | ICD-10-CM

## 2021-01-04 MED ORDER — PIMECROLIMUS 1 % EX CREA: TOPICAL_CREAM | CUTANEOUS | 11 refills | Status: AC

## 2021-01-04 MED ORDER — BRYHALI 0.01 % EX LOTN: 1.0000 "application " | TOPICAL_LOTION | CUTANEOUS | 0 refills | Status: AC

## 2021-01-04 NOTE — Patient Instructions (Signed)

## 2021-01-04 NOTE — Progress Notes (Signed)
   New Patient Visit  Subjective  [name] is a 50 y.o. male who presents for the following: Lichen planus f/u (Groin, fingernails, mouth, eyelids Bryhali prn, Elidel prn).  The following portions of the chart were reviewed this encounter and updated as appropriate:   Tobacco  Allergies  Meds  Problems  Med Hx  Surg Hx  Fam Hx     Review of Systems:  No other skin or systemic complaints except as noted in HPI or Assessment and Plan.  Objective  Well appearing patient in no apparent distress; mood and affect are within normal limits.  A focused examination was performed including face, fingernails, trunk, arms . Relevant physical exam findings are noted in the Assessment and Plan.  groin, mouth, eyelids, finernails Nail dystrophy and nail fold changes more sig R thumb and R thumbnail Wrist, axillary, eyes clear   Assessment & Plan   Seborrheic Keratoses - Stuck-on, waxy, tan-brown papules and/or plaques  - Benign-appearing - Discussed benign etiology and prognosis. - Observe - Call for any changes  Lichen planus groin, mouth, eyelids, fingernails  Bx proven 12/2017  Cont Elidel cr qd to bid prn flares to groin, eyelids, axilla, perioral Cont Bryhali qd up to 5 days a week to aa rash on arms, trunk, prn flares, avoid f/g/a  pimecrolimus (ELIDEL) 1 % cream - groin, mouth, eyelids, finernails Apply topically as directed. Qd to bid aa rash on face, under arms and groin prn flares  Halobetasol Propionate (BRYHALI) 0.01 % LOTN - groin, mouth, eyelids, finernails Apply 1 application topically as directed. Qd up to 5 days a week to aa rash on arms, trunk until clear then prn flares, avoid face, groin, axilla  Return in about 1 year (around 01/04/2022) for Lichen planus f/u.  I, Ardis Rowan, RMA, am acting as scribe for Armida Sans, MD . Documentation: I have reviewed the above documentation for accuracy and completeness, and I agree with the above.  Armida Sans, MD

## 2022-01-04 ENCOUNTER — Ambulatory Visit: Payer: Medicaid Other | Admitting: Dermatology

## 2022-06-23 ENCOUNTER — Ambulatory Visit
Admission: EM | Admit: 2022-06-23 | Discharge: 2022-06-23 | Disposition: A | Discharge status: Home / Self Care | Payer: BLUE CROSS/BLUE SHIELD | Attending: Family Medicine | Admitting: Family Medicine

## 2022-06-23 ENCOUNTER — Other Ambulatory Visit: Payer: Self-pay

## 2022-06-23 DIAGNOSIS — K644 Residual hemorrhoidal skin tags: Secondary | ICD-10-CM | POA: Diagnosis not present

## 2022-06-23 DIAGNOSIS — R300 Dysuria: Secondary | ICD-10-CM | POA: Diagnosis not present

## 2022-06-23 DIAGNOSIS — R030 Elevated blood-pressure reading, without diagnosis of hypertension: Secondary | ICD-10-CM | POA: Diagnosis not present

## 2022-06-23 LAB — URINALYSIS, W/ REFLEX TO CULTURE (INFECTION SUSPECTED)
Bacteria, UA: NONE SEEN
Bilirubin Urine: NEGATIVE
Glucose, UA: NEGATIVE mg/dL
Ketones, ur: NEGATIVE mg/dL
Leukocytes,Ua: NEGATIVE
Nitrite: NEGATIVE
Protein, ur: NEGATIVE mg/dL
Specific Gravity, Urine: 1.01 (ref 1.005–1.030)
Squamous Epithelial / HPF: NONE SEEN /HPF (ref 0–5)
WBC, UA: NONE SEEN WBC/hpf (ref 0–5)
pH: 6.5 (ref 5.0–8.0)

## 2022-06-23 MED ORDER — SULFAMETHOXAZOLE-TRIMETHOPRIM 800-160 MG PO TABS: 1.0000 | ORAL_TABLET | Freq: Two times a day (BID) | ORAL | 0 refills | Status: AC

## 2022-06-23 MED ORDER — HYDROCORTISONE (PERIANAL) 2.5 % EX CREA: 1.0000 | TOPICAL_CREAM | Freq: Two times a day (BID) | CUTANEOUS | 0 refills | Status: AC

## 2022-06-23 MED ORDER — LIDOCAINE HCL URETHRAL/MUCOSAL 2 % EX GEL: 1.0000 | CUTANEOUS | 0 refills | Status: AC | PRN

## 2022-06-23 NOTE — ED Provider Notes (Signed)
[facility]    CSN: [id number] Arrival date & time: 06/23/22  0846      History   Chief Complaint Chief Complaint  Patient presents with   Urinary Frequency   Dysuria    HPI  HPI  [name] is a 52 y.o. male.   [name] here with his wife for dysuria for the past 2 weeks. Believes he has hemorrhoids again and feels like his prostate is burning. Used tucks pads and preparation H for his hemorrhoids. Has been dealing with a sinus infection and that creates pressure in his pelvic floor. He has been straining to have bowel movements. He blew his nose while straining which increased pressure on his pelvic floor. Has chronic back pain after a work accident. Denies known STI exposure. No known symptoms in his wife. There has been no urethral discharge, rash, hematuria, history of kidney stones.  Denies testicular pain, nausea, vomiting, abdominal pain.  Notes that he does sometimes have to push his intestines from his scrotum.  He is looking for a new PCP.  He has not had a colonoscopy.      Past Medical History:  Diagnosis Date   Abdominal pain, unspecified site    Screening for lipoid disorders    Special screening for malignant neoplasm of prostate     Patient Active Problem List   Diagnosis Date Noted   Palpitations 01/09/2012    Past Surgical History:  Procedure Laterality Date   APPENDECTOMY         Home Medications    Prior to Admission medications   Medication Sig Start Date End Date Taking? Authorizing Provider  hydrocortisone (ANUSOL-HC) 2.5 % rectal cream Place 1 Application rectally 2 (two) times daily. 06/23/22  Yes Xiong Haidar, DO  lidocaine (XYLOCAINE) 2 % jelly Apply 1 Application topically as needed. 06/23/22  Yes Londell Noll, DO  sulfamethoxazole-trimethoprim (BACTRIM DS) 800-160 MG tablet Take 1 tablet by mouth 2 (two) times daily for 7 days. 06/23/22 06/30/22 Yes Latriece Anstine, DO  aspirin EC 81 MG tablet Take by mouth.     [provider]  cetirizine (ZYRTEC) 10 MG tablet Take 10 mg by mouth.    [provider]  Halobetasol Propionate (BRYHALI) 0.01 % LOTN Apply 1 application topically daily. As needed    Ralene Bathe, MD  Halobetasol Propionate (BRYHALI) 0.01 % LOTN Apply 1 application topically as directed. Qd up to 5 days a week to aa rash on arms, trunk until clear then prn flares, avoid face, groin, axilla 01/04/21   Ralene Bathe, MD  ibuprofen (ADVIL,MOTRIN) 800 MG tablet Take 800 mg by mouth.    [provider]  omeprazole (PRILOSEC) 40 MG capsule Take 40 mg by mouth. 02/04/13   [provider]  pantoprazole (PROTONIX) 40 MG tablet Take by mouth.    [provider]  pimecrolimus (ELIDEL) 1 % cream Apply topically as directed. Qd to bid aa rash on face, under arms and groin prn flares 01/04/21   Ralene Bathe, MD  tacrolimus (PROTOPIC) 0.1 % ointment Apply topically daily. Prn flares    Ralene Bathe, MD    Family History History reviewed. No pertinent family history.  Social History Social History   Tobacco Use   Smoking status: Never   Smokeless tobacco: Never  Vaping Use   Vaping Use: Never used  Substance Use Topics   Alcohol use: No    Comment: quit drinking in 2002   Drug use: No  Allergies   Amoxicillin and Omeprazole magnesium   Review of Systems Review of Systems: negative unless otherwise stated in HPI.      Physical Exam Triage Vital Signs ED Triage Vitals  Enc Vitals Group     BP 06/23/22 0928 (!) 153/103     Pulse Rate 06/23/22 0928 63     Resp 06/23/22 0928 17     Temp 06/23/22 0928 98.2 F (36.8 C)     Temp Source 06/23/22 0928 Oral     SpO2 06/23/22 0928 98 %     Weight 06/23/22 0923 220 lb (99.8 kg)     Height 06/23/22 0923 5' 9"$  (1.753 m)     Head Circumference --      Peak Flow --      Pain Score 06/23/22 0923 6     Pain Loc --      Pain Edu? --      Excl. in [place]? --    No data  found.  Updated Vital Signs BP (!) 153/103 (BP Location: Left Arm)   Pulse 63   Temp 98.2 F (36.8 C) (Oral)   Resp 17   Ht 5' 9"$  (1.753 m)   Wt 99.8 kg   SpO2 98%   BMI 32.49 kg/m   Visual Acuity Right Eye Distance:   Left Eye Distance:   Bilateral Distance:    Right Eye Near:   Left Eye Near:    Bilateral Near:     Physical Exam GEN: well appearing male in no acute distress  CVS: well perfused, RRR  RESP: speaking in full sentences without pause, no respiratory distress, CTA bilaterally   RECTAL: tenderness noted lateral right wall, external hemorrhoids noted, sphincter tone normal, no fissures PROSTATE EXAM: symmetrical and smooth, tenderness noted right prostate; chaperoned by CMA [name] SKIN: warm and dry   UC Treatments / Results  Labs (all labs ordered are listed, but only abnormal results are displayed) Labs Reviewed  URINALYSIS, W/ REFLEX TO CULTURE (INFECTION SUSPECTED) - Abnormal; Notable for the following components:      Result Value   Hgb urine dipstick TRACE (*)    All other components within normal limits    EKG   Radiology No results found.  Procedures Procedures (including critical care time)  Medications Ordered in UC Medications - No data to display  Initial Impression / Assessment and Plan / UC Course  I have reviewed the triage vital signs and the nursing notes.  Pertinent labs & imaging results that were available during my care of the patient were reviewed by me and considered in my medical decision making (see chart for details).     Pt is a 52 yo male who presents for 2 weeks of dysuria and concern for prostate burning. Overall, he is well appearing. He is afebrile.   He is hypertensive. States he should probably be on something for his blood pressure. We opted to not start medication for this today as he is in acute pain. Will need to reassess when he is at baseline.   He has history of hemorrhoids with external hemorrhoids  palable on exam. I do not see a colonoscopy in his chart. He had a endoscopy previously. There may be a rectal mass blocking flow of stool. Advised to follow up with GI or his PCP to discuss referral. Reviewed [facility] notes from June 2023 where he was seen for hemorrhoids. Treat with Anusol and Lidocaine. Continue Tucks pad use.  Recommended to  gradually increase his fiber intake to a goal of 30 grams a day. Stay hydration and exercise as tolerated by his chronic pain.    He does have some lateral right tenderness of his prostate. UA not concerning for acute cystitis.  There was some trace hematuria but this was not supported on microscopy. Treat acute prostatitis with Bactrim.  Return precautions including abdominal pain, fever, chills, nausea, or vomiting given.   Discussed MDM, treatment plan and plan for follow-up with patient/parent who agrees with plan.     Final Clinical Impressions(s) / UC Diagnoses   Final diagnoses:  Dysuria  External hemorrhoids  Elevated blood pressure reading     Discharge Instructions      Stop by the pharmacy to pick up your prescriptions.  Be sure to follow-up with a gastroenterologist to discuss a colonoscopy, your potential hernia and hemorrhoids.  It is important that you try to exercise daily as tolerated by your back pain.  Increase your fiber gradually over the next couple of weeks to try to get a goal of 30 g/day.  Stay hydrated.       ED Prescriptions     Medication Sig Dispense Auth. Provider   hydrocortisone (ANUSOL-HC) 2.5 % rectal cream Place 1 Application rectally 2 (two) times daily. 30 g Shaye Lagace, DO   lidocaine (XYLOCAINE) 2 % jelly Apply 1 Application topically as needed. 30 mL Kalin Amrhein, DO   sulfamethoxazole-trimethoprim (BACTRIM DS) 800-160 MG tablet Take 1 tablet by mouth 2 (two) times daily for 7 days. 14 tablet Pallas Wahlert, Ronnette Juniper, DO      PDMP not reviewed this encounter.   Lyndee Hensen, DO 06/23/22  1433

## 2022-06-23 NOTE — Discharge Instructions (Addendum)
Stop by the pharmacy to pick up your prescriptions.  Be sure to follow-up with a gastroenterologist to discuss a colonoscopy, your potential hernia and hemorrhoids.  It is important that you try to exercise daily as tolerated by your back pain.  Increase your fiber gradually over the next couple of weeks to try to get a goal of 30 g/day.  Stay hydrated.

## 2022-06-23 NOTE — ED Triage Notes (Signed)
Pt reports 2 weeks of urinary frequency and burning with urination. Also reports he thinks his hemorrhoid came back.

## 2022-06-25 ENCOUNTER — Telehealth: Payer: Self-pay | Admitting: Emergency Medicine

## 2022-06-25 MED ORDER — HYDROCORTISONE ACETATE 25 MG RE SUPP: 25.0000 mg | Freq: Two times a day (BID) | RECTAL | 0 refills | Status: AC

## 2022-06-25 NOTE — Telephone Encounter (Signed)
Was evaluated 2 days ago for hemorrhoids, endorses that topical cream is causing further irritation, Anusol suppository sent to pharmacy as an alternative and recommended follow-up with PCP for further evaluation and management

## 2023-10-22 DIAGNOSIS — K219 Gastro-esophageal reflux disease without esophagitis: Secondary | ICD-10-CM | POA: Diagnosis not present

## 2023-10-22 DIAGNOSIS — Z1331 Encounter for screening for depression: Secondary | ICD-10-CM | POA: Diagnosis not present

## 2023-10-22 DIAGNOSIS — G8929 Other chronic pain: Secondary | ICD-10-CM | POA: Diagnosis not present

## 2023-10-22 DIAGNOSIS — M545 Low back pain, unspecified: Secondary | ICD-10-CM | POA: Diagnosis not present

## 2023-10-22 DIAGNOSIS — I10 Essential (primary) hypertension: Secondary | ICD-10-CM | POA: Diagnosis not present

## 2023-10-22 DIAGNOSIS — Z0000 Encounter for general adult medical examination without abnormal findings: Secondary | ICD-10-CM | POA: Diagnosis not present

## 2024-01-28 DIAGNOSIS — M79604 Pain in right leg: Secondary | ICD-10-CM | POA: Diagnosis not present

## 2024-01-28 DIAGNOSIS — M25551 Pain in right hip: Secondary | ICD-10-CM | POA: Diagnosis not present

## 2024-01-28 DIAGNOSIS — S7011XA Contusion of right thigh, initial encounter: Secondary | ICD-10-CM | POA: Diagnosis not present

## 2024-01-28 DIAGNOSIS — M7989 Other specified soft tissue disorders: Secondary | ICD-10-CM | POA: Diagnosis not present

## 2024-02-10 DIAGNOSIS — M25551 Pain in right hip: Secondary | ICD-10-CM | POA: Diagnosis not present

## 2024-02-10 DIAGNOSIS — M7989 Other specified soft tissue disorders: Secondary | ICD-10-CM | POA: Diagnosis not present

## 2024-02-10 DIAGNOSIS — I824Y1 Acute embolism and thrombosis of unspecified deep veins of right proximal lower extremity: Secondary | ICD-10-CM | POA: Diagnosis not present

## 2024-02-10 DIAGNOSIS — S7011XD Contusion of right thigh, subsequent encounter: Secondary | ICD-10-CM | POA: Diagnosis not present

## 2024-02-12 DIAGNOSIS — Z1331 Encounter for screening for depression: Secondary | ICD-10-CM | POA: Diagnosis not present

## 2024-02-12 DIAGNOSIS — I82411 Acute embolism and thrombosis of right femoral vein: Secondary | ICD-10-CM | POA: Diagnosis not present

## 2024-02-12 DIAGNOSIS — Z87438 Personal history of other diseases of male genital organs: Secondary | ICD-10-CM | POA: Diagnosis not present

## 2024-02-12 DIAGNOSIS — K219 Gastro-esophageal reflux disease without esophagitis: Secondary | ICD-10-CM | POA: Diagnosis not present

## 2024-02-12 DIAGNOSIS — I10 Essential (primary) hypertension: Secondary | ICD-10-CM | POA: Diagnosis not present

## 2024-03-31 DIAGNOSIS — I82411 Acute embolism and thrombosis of right femoral vein: Secondary | ICD-10-CM | POA: Diagnosis not present

## 2024-03-31 DIAGNOSIS — G8929 Other chronic pain: Secondary | ICD-10-CM | POA: Diagnosis not present

## 2024-03-31 DIAGNOSIS — M545 Low back pain, unspecified: Secondary | ICD-10-CM | POA: Diagnosis not present

## 2024-03-31 DIAGNOSIS — Z1331 Encounter for screening for depression: Secondary | ICD-10-CM | POA: Diagnosis not present
